# Patient Record
Sex: Female | Born: 1985 | Race: White | Hispanic: No | Marital: Married | Smoking: Never smoker
Health system: Southern US, Community
[De-identification: ages and names within clinical notes are randomized; demographics above are authoritative.]

## PROBLEM LIST (undated history)

## (undated) DIAGNOSIS — J45909 Unspecified asthma, uncomplicated: Secondary | ICD-10-CM

## (undated) DIAGNOSIS — N809 Endometriosis, unspecified: Secondary | ICD-10-CM

## (undated) DIAGNOSIS — E039 Hypothyroidism, unspecified: Secondary | ICD-10-CM

## (undated) DIAGNOSIS — Z87442 Personal history of urinary calculi: Secondary | ICD-10-CM

## (undated) DIAGNOSIS — R431 Parosmia: Secondary | ICD-10-CM

## (undated) HISTORY — PX: TONSILLECTOMY: SUR1361

## (undated) HISTORY — DX: Parosmia: R43.1

## (undated) HISTORY — DX: Unspecified asthma, uncomplicated: J45.909

## (undated) HISTORY — PX: WISDOM TOOTH EXTRACTION: SHX21

## (undated) HISTORY — DX: Endometriosis, unspecified: N80.9

---

## 2012-10-07 HISTORY — PX: EXCISION, ENDOMETRIOSIS, LAPAROSCOPIC: SHX7245

## 2015-11-21 ENCOUNTER — Other Ambulatory Visit (HOSPITAL_COMMUNITY): Payer: Self-pay | Admitting: Unknown Physician Specialty

## 2015-11-21 DIAGNOSIS — O4403 Placenta previa specified as without hemorrhage, third trimester: Secondary | ICD-10-CM

## 2015-11-24 ENCOUNTER — Ambulatory Visit (HOSPITAL_COMMUNITY)
Admission: RE | Admit: 2015-11-24 | Discharge: 2015-11-24 | Disposition: A | Discharge status: Home / Self Care | Payer: 59 | Source: Ambulatory Visit | Attending: Unknown Physician Specialty | Admitting: Unknown Physician Specialty

## 2015-11-24 ENCOUNTER — Encounter (HOSPITAL_COMMUNITY): Payer: Self-pay

## 2015-11-24 DIAGNOSIS — Z3A28 28 weeks gestation of pregnancy: Secondary | ICD-10-CM | POA: Insufficient documentation

## 2015-11-24 DIAGNOSIS — Z36 Encounter for antenatal screening of mother: Secondary | ICD-10-CM | POA: Insufficient documentation

## 2015-11-24 DIAGNOSIS — O43193 Other malformation of placenta, third trimester: Secondary | ICD-10-CM | POA: Insufficient documentation

## 2015-11-24 DIAGNOSIS — O4403 Placenta previa specified as without hemorrhage, third trimester: Secondary | ICD-10-CM

## 2015-11-24 HISTORY — DX: Hypothyroidism, unspecified: E03.9

## 2015-11-24 NOTE — Consult Note (Signed)
Maternal Fetal Medicine Consultation  Requesting Provider(s): Ernestina Penna, MD  Reason for consultation: Evaluation for possible placenta accreta  HPI: Kathy Lyons is a 30 yo G2P1001, EDD 02/16/2016 currently at 28w 0d seen for evaluation and consultation due to possible placenta accreta.  Ms. Rudy reports a history of a prior term SVD without complications - was concerned about a ? Manual extraction of the placenta, but the patient herself does not recall this procedure specifically (delivery note reports delivery of the placenta with gentle traction and fundal pressure per the patient's report).  She denies any previous uterine surgery or other risk factors for placenta accreta. Her prenatal course has otherwise been uncomplicated except for a history of hypothyroidism that has been well controlled on Synthroid.  OB History: OB History    Gravida Para Term Preterm AB TAB SAB Ectopic Multiple Living   PMH:  Past Medical History  Diagnosis Date  . Hypothyroidism     PSH:  Past Surgical History  Procedure Laterality Date  . Wisdom tooth extraction     Meds:  Current Outpatient Prescriptions on File Prior to Encounter  Medication Sig Dispense Refill  . Levothyroxine Sodium (SYNTHROID PO) Take by mouth.    . Prenatal Vit-Fe Fumarate-FA (PRENATAL VITAMIN PO) Take by mouth.     No current facility-administered medications on file prior to encounter.   Allergies:  Allergies  Allergen Reactions  . Keflex [Cephalexin]    FH: Denies family history of birth defects or hereditary disorders  Soc:  Social History   Social History  . Marital Status: Married    Spouse Name: N/A  . Number of Children: N/A  . Years of Education: N/A   Occupational History  . Not on file.   Social History Main Topics  . Smoking status: Not on file  . Smokeless tobacco: Not on file  . Alcohol Use: Not on file  . Drug Use: Not on file  . Sexual Activity: Not on file    Other Topics Concern  . Not on file   Social History Narrative  . No narrative on file    Review of Systems: no vaginal bleeding or cramping/contractions, no LOF, no nausea/vomiting. All other systems reviewed and are negative.  PE:  164 lbs, 111/69, 74  GEN: well-appearing female ABD: gravid, NT  Ultrasound: Single IUP at 28w 0d Somewhat limited views of the ductal arch / aortic arch obtained The remainder of the fetal anatomy appears normal Fetal growth is appropriate (65th %tile) Anterior placenta without previa - no sonographic markers associated with placenta accreta were appreciated. (on Color Doppler or B-mode sonography) Normal amniotic fluid volume  A/P: 1) Single IUP at 28w 0d  2) Concerns for possible placenta accreta - no sonographic findings associated with placenta accreta were appreciated (no placental lacunae, normal clear space, normal Color flow Doppler).  In the absence of placenta previa or prior uterine surgery, feel that the risk for placenta accreta is extremely low.  We discussed the fact that the positive predictive value of both ultrasound and MRI is poor for the diagnosis of accreta and that ultimately it is a clinical diagnosis at the time of delivery.  Based on ultrasound findings and clinical picture, would not recommend further evaluation - and in the absence of other complications, should be able to delivery at [facility].   Thank you for the opportunity to be a part  of the care of Amandeep W Chism. Please contact our office if we can be of further assistance.   I spent approximately 30 minutes with this patient with over 50% of time spent in face-to-face counseling.  Alpha Gula, MD Maternal Fetal Medicine

## 2016-09-28 ENCOUNTER — Encounter (HOSPITAL_COMMUNITY): Payer: Self-pay

## 2017-11-28 DIAGNOSIS — Z6830 Body mass index (BMI) 30.0-30.9, adult: Secondary | ICD-10-CM | POA: Diagnosis not present

## 2017-11-28 DIAGNOSIS — J069 Acute upper respiratory infection, unspecified: Secondary | ICD-10-CM | POA: Diagnosis not present

## 2017-11-28 DIAGNOSIS — J309 Allergic rhinitis, unspecified: Secondary | ICD-10-CM | POA: Diagnosis not present

## 2018-01-09 DIAGNOSIS — Z1331 Encounter for screening for depression: Secondary | ICD-10-CM | POA: Diagnosis not present

## 2018-01-09 DIAGNOSIS — Z1389 Encounter for screening for other disorder: Secondary | ICD-10-CM | POA: Diagnosis not present

## 2018-01-09 DIAGNOSIS — Z6830 Body mass index (BMI) 30.0-30.9, adult: Secondary | ICD-10-CM | POA: Diagnosis not present

## 2018-01-09 DIAGNOSIS — R635 Abnormal weight gain: Secondary | ICD-10-CM | POA: Diagnosis not present

## 2018-04-03 DIAGNOSIS — Z01419 Encounter for gynecological examination (general) (routine) without abnormal findings: Secondary | ICD-10-CM | POA: Diagnosis not present

## 2018-04-03 DIAGNOSIS — Z6832 Body mass index (BMI) 32.0-32.9, adult: Secondary | ICD-10-CM | POA: Diagnosis not present

## 2018-04-22 DIAGNOSIS — Z6830 Body mass index (BMI) 30.0-30.9, adult: Secondary | ICD-10-CM | POA: Diagnosis not present

## 2018-04-22 DIAGNOSIS — J029 Acute pharyngitis, unspecified: Secondary | ICD-10-CM | POA: Diagnosis not present

## 2018-04-22 DIAGNOSIS — J019 Acute sinusitis, unspecified: Secondary | ICD-10-CM | POA: Diagnosis not present

## 2018-06-05 DIAGNOSIS — L700 Acne vulgaris: Secondary | ICD-10-CM | POA: Diagnosis not present

## 2018-06-05 DIAGNOSIS — D1801 Hemangioma of skin and subcutaneous tissue: Secondary | ICD-10-CM | POA: Diagnosis not present

## 2018-06-05 DIAGNOSIS — D485 Neoplasm of uncertain behavior of skin: Secondary | ICD-10-CM | POA: Diagnosis not present

## 2018-06-05 DIAGNOSIS — D2262 Melanocytic nevi of left upper limb, including shoulder: Secondary | ICD-10-CM | POA: Diagnosis not present

## 2018-06-05 DIAGNOSIS — D2371 Other benign neoplasm of skin of right lower limb, including hip: Secondary | ICD-10-CM | POA: Diagnosis not present

## 2018-06-12 DIAGNOSIS — Z6829 Body mass index (BMI) 29.0-29.9, adult: Secondary | ICD-10-CM | POA: Diagnosis not present

## 2018-06-12 DIAGNOSIS — J209 Acute bronchitis, unspecified: Secondary | ICD-10-CM | POA: Diagnosis not present

## 2018-07-17 DIAGNOSIS — Z23 Encounter for immunization: Secondary | ICD-10-CM | POA: Diagnosis not present

## 2018-08-28 DIAGNOSIS — J209 Acute bronchitis, unspecified: Secondary | ICD-10-CM | POA: Diagnosis not present

## 2018-08-28 DIAGNOSIS — Z6829 Body mass index (BMI) 29.0-29.9, adult: Secondary | ICD-10-CM | POA: Diagnosis not present

## 2018-09-28 DIAGNOSIS — L209 Atopic dermatitis, unspecified: Secondary | ICD-10-CM | POA: Diagnosis not present

## 2018-09-28 DIAGNOSIS — Z6829 Body mass index (BMI) 29.0-29.9, adult: Secondary | ICD-10-CM | POA: Diagnosis not present

## 2018-10-30 DIAGNOSIS — N809 Endometriosis, unspecified: Secondary | ICD-10-CM | POA: Diagnosis not present

## 2018-10-30 DIAGNOSIS — Z01419 Encounter for gynecological examination (general) (routine) without abnormal findings: Secondary | ICD-10-CM | POA: Diagnosis not present

## 2018-10-30 DIAGNOSIS — E559 Vitamin D deficiency, unspecified: Secondary | ICD-10-CM | POA: Diagnosis not present

## 2018-10-30 DIAGNOSIS — E039 Hypothyroidism, unspecified: Secondary | ICD-10-CM | POA: Diagnosis not present

## 2018-11-30 DIAGNOSIS — J209 Acute bronchitis, unspecified: Secondary | ICD-10-CM | POA: Diagnosis not present

## 2018-11-30 DIAGNOSIS — Z6830 Body mass index (BMI) 30.0-30.9, adult: Secondary | ICD-10-CM | POA: Diagnosis not present

## 2019-03-05 DIAGNOSIS — Z209 Contact with and (suspected) exposure to unspecified communicable disease: Secondary | ICD-10-CM | POA: Diagnosis not present

## 2019-03-12 DIAGNOSIS — J0101 Acute recurrent maxillary sinusitis: Secondary | ICD-10-CM | POA: Diagnosis not present

## 2019-04-16 DIAGNOSIS — J019 Acute sinusitis, unspecified: Secondary | ICD-10-CM | POA: Diagnosis not present

## 2019-04-16 DIAGNOSIS — Z6830 Body mass index (BMI) 30.0-30.9, adult: Secondary | ICD-10-CM | POA: Diagnosis not present

## 2019-04-16 DIAGNOSIS — J45901 Unspecified asthma with (acute) exacerbation: Secondary | ICD-10-CM | POA: Diagnosis not present

## 2020-06-02 ENCOUNTER — Encounter: Payer: Self-pay | Admitting: Allergy & Immunology

## 2020-06-02 ENCOUNTER — Other Ambulatory Visit: Payer: Self-pay

## 2020-06-02 ENCOUNTER — Ambulatory Visit (INDEPENDENT_AMBULATORY_CARE_PROVIDER_SITE_OTHER): Payer: PRIVATE HEALTH INSURANCE | Admitting: Allergy & Immunology

## 2020-06-02 VITALS — BP 124/86 | HR 68 | Temp 98.5°F | Resp 18 | Ht 62.0 in | Wt 181.4 lb

## 2020-06-02 DIAGNOSIS — J4540 Moderate persistent asthma, uncomplicated: Secondary | ICD-10-CM

## 2020-06-02 DIAGNOSIS — J3089 Other allergic rhinitis: Secondary | ICD-10-CM | POA: Diagnosis not present

## 2020-06-02 DIAGNOSIS — J302 Other seasonal allergic rhinitis: Secondary | ICD-10-CM | POA: Diagnosis not present

## 2020-06-02 NOTE — Patient Instructions (Addendum)
1. Moderate persistent asthma, uncomplicated - Lung testing looked good today. - You are having fairly frequent asthma symptoms, so I think you should use the Symbicort every day for the best control. - Spacer sample and demonstration provided. - Daily controller medication(s): Symbicort 160/4.38mcg two puffs twice daily with spacer - Prior to physical activity: albuterol 2 puffs 10-15 minutes before physical activity. - Rescue medications: albuterol 4 puffs every 4-6 hours as needed - Asthma control goals:  * Full participation in all desired activities (may need albuterol before activity) * Albuterol use two time or less a week on average (not counting use with activity) * Cough interfering with sleep two time or less a month * Oral steroids no more than once a year * No hospitalizations  2. Seasonal and perennial allergic rhinitis - Testing today showed: grasses, ragweed, weeds, trees, indoor molds, outdoor molds, dust mites, cat and dog - Copy of test results provided.  - Avoidance measures provided. - Stop taking: Claritin - Start taking: Xyzal (levocetirizine) 5mg  tablet once daily, Flonase (fluticasone) one spray per nostril daily and Astelin (azelastine) 2 sprays per nostril 1-2 times daily as needed - You can use an extra dose of the antihistamine, if needed, for breakthrough symptoms.  - Consider nasal saline rinses 1-2 times daily to remove allergens from the nasal cavities as well as help with mucous clearance (this is especially helpful to do before the nasal sprays are given) - Consider allergy shots as a means of long-term control. - Allergy shots "re-train" and "reset" the immune system to ignore environmental allergens and decrease the resulting immune response to those allergens (sneezing, itchy watery eyes, runny nose, nasal congestion, etc).  - Allergy shots improve symptoms in 75-85% of patients.  - We can discuss more at the next appointment if the medications are not  working for you.  3. Return in about 6 weeks (around 07/14/2020).   Please inform 09/13/2020 of any Emergency Department visits, hospitalizations, or changes in symptoms. Call us before going to the ED for breathing or allergy symptoms since we might be able to fit you in for a sick visit. Feel free to contact us anytime with any questions, problems, or concerns.  It was a pleasure to meet you today!  Websites that have reliable patient information: 1. American Academy of Asthma, Allergy, and Immunology: www.aaaai.org 2. Food Allergy Research and Education (FARE): foodallergy.org 3. Mothers of Asthmatics: http://www.asthmacommunitynetwork.org 4. American College of Allergy, Asthma, and Immunology: www.acaai.org   COVID-19 Vaccine Information can be found at: [place] For questions related to vaccine distribution or appointments, please email [email or web address] or call [phone].     "Like" [phone] on Facebook and Instagram for our latest updates!        Make sure you are registered to vote! If you have moved or changed any of your contact information, you will need to get this updated before voting!  In some cases, you MAY be able to register to vote online: [place]    Reducing Pollen Exposure  The American Academy of Allergy, Asthma and Immunology suggests the following steps to reduce your exposure to pollen during allergy seasons.    1. Do not hang sheets or clothing out to dry; pollen may collect on these items. 2. Do not mow lawns or spend time around freshly cut grass; mowing stirs up pollen. 3. Keep windows closed at night.  Keep car windows closed while driving. 4. Minimize morning activities outdoors, a time when pollen counts are usually  at their highest. 5. Stay indoors as much as possible when pollen counts or humidity is high and on windy days when pollen tends to  remain in the air longer. 6. Use air conditioning when possible.  Many air conditioners have filters that trap the pollen spores. 7. Use a HEPA room air filter to remove pollen form the indoor air you breathe.  Control of Mold Allergen   Mold and fungi can grow on a variety of surfaces provided certain temperature and moisture conditions exist.  Outdoor molds grow on plants, decaying vegetation and soil.  The major outdoor mold, Alternaria and Cladosporium, are found in very high numbers during hot and dry conditions.  Generally, a late Summer - Fall peak is seen for common outdoor fungal spores.  Rain will temporarily lower outdoor mold spore count, but counts rise rapidly when the rainy period ends.  The most important indoor molds are Aspergillus and Penicillium.  Dark, humid and poorly ventilated basements are ideal sites for mold growth.  The next most common sites of mold growth are the bathroom and the kitchen.  Outdoor (Seasonal) Mold Control   1. Use air conditioning and keep windows closed 2. Avoid exposure to decaying vegetation. 3. Avoid leaf raking. 4. Avoid grain handling. 5. Consider wearing a face mask if working in moldy areas.    Indoor (Perennial) Mold Control    1. Maintain humidity below 50%. 2. Clean washable surfaces with 5% bleach solution. 3. Remove sources e.g. contaminated carpets.     Control of Dust Mite Allergen    Dust mites play a major role in allergic asthma and rhinitis.  They occur in environments with high humidity wherever human skin is found.  Dust mites absorb humidity from the atmosphere (ie, they do not drink) and feed on organic matter (including shed human and animal skin).  Dust mites are a microscopic type of insect that you cannot see with the naked eye.  High levels of dust mites have been detected from mattresses, pillows, carpets, upholstered furniture, bed covers, clothes, soft toys and any woven material.  The principal allergen of  the dust mite is found in its feces.  A gram of dust may contain 1,000 mites and 250,000 fecal particles.  Mite antigen is easily measured in the air during house cleaning activities.  Dust mites do not bite and do not cause harm to humans, other than by triggering allergies/asthma.    Ways to decrease your exposure to dust mites in your home:  1. Encase mattresses, box springs and pillows with a mite-impermeable barrier or cover   2. Wash sheets, blankets and drapes weekly in hot water (130 F) with detergent and dry them in a dryer on the hot setting.  3. Have the room cleaned frequently with a vacuum cleaner and a damp dust-mop.  For carpeting or rugs, vacuuming with a vacuum cleaner equipped with a high-efficiency particulate air (HEPA) filter.  The dust mite allergic individual should not be in a room which is being cleaned and should wait 1 hour after cleaning before going into the room. 4. Do not sleep on upholstered furniture (eg, couches).   5. If possible removing carpeting, upholstered furniture and drapery from the home is ideal.  Horizontal blinds should be eliminated in the rooms where the person spends the most time (bedroom, study, television room).  Washable vinyl, roller-type shades are optimal. 6. Remove all non-washable stuffed toys from the bedroom.  Wash stuffed toys weekly like sheets and  blankets above.   7. Reduce indoor humidity to less than 50%.  Inexpensive humidity monitors can be purchased at most hardware stores.  Do not use a humidifier as can make the problem worse and are not recommended.  Control of Dog or Cat Allergen  Avoidance is the best way to manage a dog or cat allergy. If you have a dog or cat and are allergic to dog or cats, consider removing the dog or cat from the home. If you have a dog or cat but don't want to find it a new home, or if your family wants a pet even though someone in the household is allergic, here are some strategies that may help keep  symptoms at bay:  1. Keep the pet out of your bedroom and restrict it to only a few rooms. Be advised that keeping the dog or cat in only one room will not limit the allergens to that room. 2. Don't pet, hug or kiss the dog or cat; if you do, wash your hands with soap and water. 3. High-efficiency particulate air (HEPA) cleaners run continuously in a bedroom or living room can reduce allergen levels over time. 4. Regular use of a high-efficiency vacuum cleaner or a central vacuum can reduce allergen levels. 5. Giving your dog or cat a bath at least once a week can reduce airborne allergen.  Allergy Shots   Allergies are the result of a chain reaction that starts in the immune system. Your immune system controls how your body defends itself. For instance, if you have an allergy to pollen, your immune system identifies pollen as an invader or allergen. Your immune system overreacts by producing antibodies called Immunoglobulin E (IgE). These antibodies travel to cells that release chemicals, causing an allergic reaction.  The concept behind allergy immunotherapy, whether it is received in the form of shots or tablets, is that the immune system can be desensitized to specific allergens that trigger allergy symptoms. Although it requires time and patience, the payback can be long-term relief.  How Do Allergy Shots Work?  Allergy shots work much like a vaccine. Your body responds to injected amounts of a particular allergen given in increasing doses, eventually developing a resistance and tolerance to it. Allergy shots can lead to decreased, minimal or no allergy symptoms.  There generally are two phases: build-up and maintenance. Build-up often ranges from three to six months and involves receiving injections with increasing amounts of the allergens. The shots are typically given once or twice a week, though more rapid build-up schedules are sometimes used.  The maintenance phase begins when the most  effective dose is reached. This dose is different for each person, depending on how allergic you are and your response to the build-up injections. Once the maintenance dose is reached, there are longer periods between injections, typically two to four weeks.  Occasionally doctors give cortisone-type shots that can temporarily reduce allergy symptoms. These types of shots are different and should not be confused with allergy immunotherapy shots.  Who Can Be Treated with Allergy Shots?  Allergy shots may be a good treatment approach for people with allergic rhinitis (hay fever), allergic asthma, conjunctivitis (eye allergy) or stinging insect allergy.   Before deciding to begin allergy shots, you should consider:  . The length of allergy season and the severity of your symptoms . Whether medications and/or changes to your environment can control your symptoms . Your desire to avoid long-term medication use . Time: allergy immunotherapy requires  a major time commitment . Cost: may vary depending on your insurance coverage  Allergy shots for children age 69 and older are effective and often well tolerated. They might prevent the onset of new allergen sensitivities or the progression to asthma.  Allergy shots are not started on patients who are pregnant but can be continued on patients who become pregnant while receiving them. In some patients with other medical conditions or who take certain common medications, allergy shots may be of risk. It is important to mention other medications you talk to your allergist.   When Will I Feel Better?  Some may experience decreased allergy symptoms during the build-up phase. For others, it may take as long as 12 months on the maintenance dose. If there is no improvement after a year of maintenance, your allergist will discuss other treatment options with you.  If you aren't responding to allergy shots, it may be because there is not enough dose of the  allergen in your vaccine or there are missing allergens that were not identified during your allergy testing. Other reasons could be that there are high levels of the allergen in your environment or major exposure to non-allergic triggers like tobacco smoke.  What Is the Length of Treatment?  Once the maintenance dose is reached, allergy shots are generally continued for three to five years. The decision to stop should be discussed with your allergist at that time. Some people may experience a permanent reduction of allergy symptoms. Others may relapse and a longer course of allergy shots can be considered.  What Are the Possible Reactions?  The two types of adverse reactions that can occur with allergy shots are local and systemic. Common local reactions include very mild redness and swelling at the injection site, which can happen immediately or several hours after. A systemic reaction, which is less common, affects the entire body or a particular body system. They are usually mild and typically respond quickly to medications. Signs include increased allergy symptoms such as sneezing, a stuffy nose or hives.  Rarely, a serious systemic reaction called anaphylaxis can develop. Symptoms include swelling in the throat, wheezing, a feeling of tightness in the chest, nausea or dizziness. Most serious systemic reactions develop within 30 minutes of allergy shots. This is why it is strongly recommended you wait in your doctor's office for 30 minutes after your injections. Your allergist is trained to watch for reactions, and his or her staff is trained and equipped with the proper medications to identify and treat them.  Who Should Administer Allergy Shots?  The preferred location for receiving shots is your prescribing allergist's office. Injections can sometimes be given at another facility where the physician and staff are trained to recognize and treat reactions, and have received instructions by your  prescribing allergist.

## 2020-06-02 NOTE — Progress Notes (Addendum)
NEW PATIENT  Date of Service/Encounter:  06/05/20  Referring provider: Juliette Alcide, MD   Assessment:   Moderate persistent asthma, uncomplicated  Seasonal and perennial allergic rhinitis (grasses, ragweed, weeds, trees, indoor molds, outdoor molds, dust mites, cat and dog)  Plan/Recommendations:   1. Moderate persistent asthma, uncomplicated - Lung testing looked good today. - You are having fairly frequent asthma symptoms, so I think you should use the Symbicort every day for the best control. - Spacer sample and demonstration provided. - Daily controller medication(s): Symbicort 160/4.59mcg two puffs twice daily with spacer - Prior to physical activity: albuterol 2 puffs 10-15 minutes before physical activity. - Rescue medications: albuterol 4 puffs every 4-6 hours as needed - Asthma control goals:  * Full participation in all desired activities (may need albuterol before activity) * Albuterol use two time or less a week on average (not counting use with activity) * Cough interfering with sleep two time or less a month * Oral steroids no more than once a year * No hospitalizations  2. Seasonal and perennial allergic rhinitis - Testing today showed: grasses, ragweed, weeds, trees, indoor molds, outdoor molds, dust mites, cat and dog - Copy of test results provided.  - Avoidance measures provided. - Stop taking: Claritin - Start taking: Xyzal (levocetirizine) 5mg  tablet once daily, Flonase (fluticasone) one spray per nostril daily and Astelin (azelastine) 2 sprays per nostril 1-2 times daily as needed - You can use an extra dose of the antihistamine, if needed, for breakthrough symptoms.  - Consider nasal saline rinses 1-2 times daily to remove allergens from the nasal cavities as well as help with mucous clearance (this is especially helpful to do before the nasal sprays are given) - Consider allergy shots as a means of long-term control. - Allergy shots "re-train" and  "reset" the immune system to ignore environmental allergens and decrease the resulting immune response to those allergens (sneezing, itchy watery eyes, runny nose, nasal congestion, etc).  - Allergy shots improve symptoms in 75-85% of patients.  - We can discuss more at the next appointment if the medications are not working for you.  3. Return in about 6 weeks (around 07/14/2020).   Subjective:   CHARLESIA CANADAY is a 34 y.o. female presenting today for evaluation of  Chief Complaint  Patient presents with  . Allergic Rhinitis   . Wheezing    ADYSON VANBUREN has a history of the following: Patient Active Problem List   Diagnosis Date Noted  . Moderate persistent asthma, uncomplicated 06/05/2020  . Seasonal and perennial allergic rhinitis 06/05/2020    History obtained from: chart review and patient.  SALIMATA CHRISTENSON was referred by Rodena Piety, MD.      [place] is a 34 y.o. female presenting for an evaluation of allergies and asthma.  She did have COVID-19 in December 2020.  She seems of had lingering symptoms from that, and according to her PCP note she felt worse in February 2021.   Asthma/Respiratory Symptom History: She started having breathing problems after FluMist in October 2019. She was placed on prednisone for wheezing and respiratory illnesses every 2-3 months throughout 2020. She has continued to have wheezing issues over the last 18 months, even at baseline. She is currently on montelukast (added around one year ago). She had to have her albuterol inhaler refilled and she used that every now and then. She also uses Symbicort on a daily basis.   Allergic Rhinitis Symptom History: She had  environmental allergies as a child. She had a problem with asthmatic bronchitis. She would develop that in the fall. She has not had trouble in years with anything asthma related. She had allergies throughout her life and would alternate antihistamines.   She has a history of long  hauler COVID syndrome.   Otherwise, there is no history of other atopic diseases, including food allergies, drug allergies, stinging insect allergies, eczema, urticaria or contact dermatitis. There is no significant infectious history. Vaccinations are up to date.    Past Medical History: Patient Active Problem List   Diagnosis Date Noted  . Moderate persistent asthma, uncomplicated 06/05/2020  . Seasonal and perennial allergic rhinitis 06/05/2020    Medication List:  Allergies as of 06/02/2020      Reactions   Keflex [cephalexin]       Medication List       Accurate as of June 02, 2020 11:59 PM. If you have any questions, ask your nurse or doctor.        STOP taking these medications   Albuterol Sulfate 108 (90 Base) MCG/ACT Aepb Commonly known as: PROAIR RESPICLICK Replaced by: albuterol 108 (90 Base) MCG/ACT inhaler Stopped by: Alfonse Spruce, MD   budesonide-formoterol 80-4.5 MCG/ACT inhaler Commonly known as: SYMBICORT Replaced by: budesonide-formoterol 160-4.5 MCG/ACT inhaler Stopped by: Alfonse Spruce, MD     TAKE these medications   albuterol 108 (90 Base) MCG/ACT inhaler Commonly known as: VENTOLIN HFA 4 puffs every 4-6 hours as needed. May use 2 puffs 10-15 minutes before physical activity. Replaces: Albuterol Sulfate 108 (90 Base) MCG/ACT Aepb Started by: Alfonse Spruce, MD   ALPRAZolam 0.5 MG tablet Commonly known as: XANAX Take by mouth.   azelastine 0.1 % nasal spray Commonly known as: ASTELIN Place 2 sprays into both nostrils 2 (two) times daily. Started by: Alfonse Spruce, MD   Black Elderberry(Berry-Flower) 575 MG Caps Take by mouth.   budesonide-formoterol 160-4.5 MCG/ACT inhaler Commonly known as: Symbicort Inhale 2 puffs into the lungs 2 (two) times daily. With spacer. Replaces: budesonide-formoterol 80-4.5 MCG/ACT inhaler Started by: Alfonse Spruce, MD   fluticasone 50 MCG/ACT nasal spray Commonly known  as: FLONASE Place 1 spray into both nostrils daily. Started by: Alfonse Spruce, MD   levocetirizine 5 MG tablet Commonly known as: XYZAL Take 1 tablet (5 mg total) by mouth every evening. Started by: Alfonse Spruce, MD   levothyroxine 75 MCG tablet Commonly known as: SYNTHROID Take 75 mcg by mouth daily. What changed: Another medication with the same name was removed. Continue taking this medication, and follow the directions you see here. Changed by: Alfonse Spruce, MD   montelukast 10 MG tablet Commonly known as: SINGULAIR Take by mouth.   Multi-Vitamin tablet Take 1 tablet by mouth daily.   pantoprazole 40 MG tablet Commonly known as: PROTONIX Take 40 mg by mouth daily.   PRENATAL VITAMIN PO Take by mouth.   tretinoin 0.05 % cream Commonly known as: RETIN-A Apply topically.       Birth History: non-contributory  Developmental History: non-contributory  Past Surgical History: Past Surgical History:  Procedure Laterality Date  . TONSILLECTOMY    . WISDOM TOOTH EXTRACTION       Family History: Family History  Problem Relation Age of Onset  . Heart disease Mother   . Hypertension Mother   . Heart disease Father   . Hypertension Father   . Hypertension Maternal Grandmother   . Hypertension Maternal Grandfather   .  Hypertension Paternal Grandmother   . Hypertension Paternal Grandfather      Social History: [place] lives at home with her family.  There is no smoking exposure now, but she did around 19 years ago when she was a teenager.  They live in a house that was built in 2005.  There might be some mold under the home.  They have gas heating and central cooling.  There are cats outside of the home.  She does have dust mite covers on the bedding.  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  She is exposed to fumes, chemicals, or dust in the workplace.  She does not have a HEPA filter in the home.  She does not live near an  interstate or industrial area.    Review of Systems  Constitutional: Negative.  Negative for chills, fever, malaise/fatigue and weight loss.  HENT: Positive for congestion and sinus pain. Negative for ear discharge and ear pain.   Eyes: Negative for pain, discharge and redness.  Respiratory: Negative for cough, sputum production, shortness of breath and wheezing.   Cardiovascular: Negative.  Negative for chest pain and palpitations.  Gastrointestinal: Negative for abdominal pain, constipation, diarrhea, heartburn, nausea and vomiting.  Skin: Negative.  Negative for itching and rash.  Neurological: Negative for dizziness and headaches.  Endo/Heme/Allergies: Positive for environmental allergies. Does not bruise/bleed easily.       Objective:   Blood pressure 124/86, pulse 68, temperature 98.5 F (36.9 C), temperature source Temporal, resp. rate 18, height 5\' 2"  (1.575 m), weight 181 lb 6.4 oz (82.3 kg), SpO2 99 %, unknown if currently breastfeeding. Body mass index is 33.18 kg/m.   Physical Exam:   Physical Exam Constitutional:      Appearance: She is well-developed.     Comments: Pleasant female.  Cooperative with the exam.  HENT:     Head: Normocephalic and atraumatic.     Right Ear: Tympanic membrane, ear canal and external ear normal. No drainage, swelling or tenderness. Tympanic membrane is not injected, scarred, erythematous, retracted or bulging.     Left Ear: Tympanic membrane, ear canal and external ear normal. No drainage, swelling or tenderness. Tympanic membrane is not injected, scarred, erythematous, retracted or bulging.     Nose: No nasal deformity, septal deviation, mucosal edema or rhinorrhea.     Right Turbinates: Enlarged, swollen and pale.     Left Turbinates: Enlarged, swollen and pale.     Right Sinus: No maxillary sinus tenderness or frontal sinus tenderness.     Left Sinus: No maxillary sinus tenderness or frontal sinus tenderness.     Mouth/Throat:      Mouth: Mucous membranes are not pale and not dry.     Pharynx: Uvula midline.     Comments: Cobblestoning present in the posterior oropharynx. Eyes:     General:        Right eye: No discharge.        Left eye: No discharge.     Conjunctiva/sclera: Conjunctivae normal.     Right eye: Right conjunctiva is not injected. No chemosis.    Left eye: Left conjunctiva is not injected. No chemosis.    Pupils: Pupils are equal, round, and reactive to light.  Cardiovascular:     Rate and Rhythm: Normal rate and regular rhythm.     Heart sounds: Normal heart sounds.  Pulmonary:     Effort: Pulmonary effort is normal. No tachypnea, accessory muscle usage or respiratory distress.  Breath sounds: Normal breath sounds. No wheezing, rhonchi or rales.     Comments: Moving air well in all lung fields.  No increased work of breathing. Chest:     Chest wall: No tenderness.  Abdominal:     Tenderness: There is no abdominal tenderness. There is no guarding or rebound.  Lymphadenopathy:     Head:     Right side of head: No submandibular, tonsillar or occipital adenopathy.     Left side of head: No submandibular, tonsillar or occipital adenopathy.     Cervical: No cervical adenopathy.  Skin:    Coloration: Skin is not pale.     Findings: No abrasion, erythema, petechiae or rash. Rash is not papular, urticarial or vesicular.     Comments: No eczematous or urticarial lesions noted.  Neurological:     Mental Status: She is alert.      Diagnostic studies:   Spirometry: results normal (FEV1: 2.67/92%, FVC: 3.57/103%, FEV1/FVC: 75%).    Spirometry consistent with normal pattern.   Allergy Studies:     Airborne Adult Perc - 06/02/20 1439    Time Antigen Placed 1439    Allergen Manufacturer Waynette ButteryGreer    Location Back    Number of Test 59    Panel 1 Select    1. Control-Buffer 50% Glycerol Negative    2. Control-Histamine 1 mg/ml 2+    3. Albumin saline Negative    4. Bahia 3+    5. [place]  Negative    6. Johnson 3+    7. [place] Blue 3+    8. Meadow Fescue 4+    9. Perennial Rye 4+    10. Sweet Vernal 4+    11. Timothy 4+    12. Cocklebur Negative    13. Burweed Marshelder Negative    14. Ragweed, short 3+    15. Ragweed, Giant Negative    16. Plantain,  English 2+    17. Lamb's Quarters Negative    18. Sheep Sorrell Negative    19. Rough Pigweed Negative    20. Marsh Elder, Rough Negative    21. Mugwort, Common Negative    22. Ash mix Negative    23. Birch mix Negative    24. Beech American Negative    25. Box, Elder 2+    26. Cedar, red Negative    27. Cottonwood, Guinea[place]Biss[place]astern Negative    28. Elm mix Negative    29. Hickory Negative    30. Maple mix 3+    31. Oak, Guinea[place] mix 2+    32. Pecan Pollen Negative    33. Pine mix Negative    34. Sycamore Eastern Negative    35. Walnut, Black Pollen Negative    36. Alternaria alternata 2+    37. Cladosporium Herbarum Negative    38. Aspergillus mix Negative    39. Penicillium mix Negative    40. Bipolaris sorokiniana (Helminthosporium) Negative    41. Drechslera spicifera (Curvularia) Negative    42. Mucor plumbeus Negative    43. Fusarium moniliforme Negative    44. Aureobasidium pullulans (pullulara) Negative    45. Rhizopus oryzae Negative    46. Botrytis cinera Negative    47. Epicoccum nigrum 2+    48. Phoma betae Negative    49. Candida Albicans Negative    50. Trichophyton mentagrophytes Negative    51. Mite, D Farinae  5,000 AU/ml 4+    52. Mite, D Pteronyssinus  5,000 AU/ml 4+    53. Cat  Hair 10,000 BAU/ml 2+    54.  Dog Epithelia Negative    55. Mixed Feathers Negative    56. Horse Epithelia Negative    57. Cockroach, German Negative    58. Mouse Negative    59. Tobacco Leaf 2+          Intradermal - 06/02/20 1515    Time Antigen Placed 1515    Allergen Manufacturer Waynette Buttery    Location Arm    Number of Test 7    Intradermal Select    Control Negative    Weed mix 2+    Mold 2 1+    Mold 3  2+    Mold 4 Negative    Dog 4+    Cockroach Negative           Allergy testing results were read and interpreted by myself, documented by clinical staff.         Malachi Bonds, MD [facility]

## 2020-06-05 ENCOUNTER — Encounter: Payer: Self-pay | Admitting: Allergy & Immunology

## 2020-06-05 DIAGNOSIS — J4540 Moderate persistent asthma, uncomplicated: Secondary | ICD-10-CM | POA: Insufficient documentation

## 2020-06-05 DIAGNOSIS — J302 Other seasonal allergic rhinitis: Secondary | ICD-10-CM | POA: Insufficient documentation

## 2020-06-05 MED ORDER — FLUTICASONE PROPIONATE 50 MCG/ACT NA SUSP: 1.0000 | Freq: Every day | NASAL | 5 refills | Status: AC

## 2020-06-05 MED ORDER — AZELASTINE HCL 0.1 % NA SOLN: 2.0000 | Freq: Two times a day (BID) | NASAL | 5 refills | Status: AC

## 2020-06-05 MED ORDER — ALBUTEROL SULFATE HFA 108 (90 BASE) MCG/ACT IN AERS: INHALATION_SPRAY | RESPIRATORY_TRACT | 1 refills | Status: DC

## 2020-06-05 MED ORDER — LEVOCETIRIZINE DIHYDROCHLORIDE 5 MG PO TABS: 5.0000 mg | ORAL_TABLET | Freq: Every evening | ORAL | 5 refills | Status: DC

## 2020-06-05 MED ORDER — BUDESONIDE-FORMOTEROL FUMARATE 160-4.5 MCG/ACT IN AERO: 2.0000 | INHALATION_SPRAY | Freq: Two times a day (BID) | RESPIRATORY_TRACT | 5 refills | Status: DC

## 2020-07-21 ENCOUNTER — Ambulatory Visit: Payer: Self-pay | Admitting: Allergy & Immunology

## 2020-09-04 ENCOUNTER — Other Ambulatory Visit: Payer: Self-pay | Admitting: Allergy & Immunology

## 2020-10-25 ENCOUNTER — Emergency Department (HOSPITAL_COMMUNITY)
Admission: EM | Admit: 2020-10-25 | Discharge: 2020-10-25 | Disposition: A | Discharge status: Home / Self Care | Payer: PRIVATE HEALTH INSURANCE | Attending: Emergency Medicine | Admitting: Emergency Medicine

## 2020-10-25 ENCOUNTER — Encounter (HOSPITAL_COMMUNITY): Payer: Self-pay | Admitting: Emergency Medicine

## 2020-10-25 ENCOUNTER — Other Ambulatory Visit: Payer: Self-pay

## 2020-10-25 DIAGNOSIS — Z5321 Procedure and treatment not carried out due to patient leaving prior to being seen by health care provider: Secondary | ICD-10-CM | POA: Insufficient documentation

## 2020-10-25 DIAGNOSIS — R109 Unspecified abdominal pain: Secondary | ICD-10-CM | POA: Insufficient documentation

## 2020-10-25 NOTE — ED Triage Notes (Signed)
Pt c/o right flank pain that started a few hours ago.

## 2021-01-13 ENCOUNTER — Other Ambulatory Visit: Payer: Self-pay | Admitting: Obstetrics and Gynecology

## 2021-01-13 ENCOUNTER — Other Ambulatory Visit: Payer: Self-pay | Admitting: *Deleted

## 2021-01-13 DIAGNOSIS — N6452 Nipple discharge: Secondary | ICD-10-CM

## 2021-06-23 ENCOUNTER — Other Ambulatory Visit: Payer: Self-pay | Admitting: Allergy & Immunology

## 2021-07-10 ENCOUNTER — Other Ambulatory Visit: Payer: Self-pay | Admitting: Allergy & Immunology

## 2021-08-11 ENCOUNTER — Other Ambulatory Visit: Payer: Self-pay | Admitting: Allergy & Immunology

## 2021-08-29 ENCOUNTER — Other Ambulatory Visit: Payer: Self-pay | Admitting: Allergy & Immunology

## 2021-11-23 ENCOUNTER — Ambulatory Visit
Admission: RE | Admit: 2021-11-23 | Discharge: 2021-11-23 | Disposition: A | Discharge status: Home / Self Care | Payer: PRIVATE HEALTH INSURANCE | Source: Ambulatory Visit | Attending: Obstetrics and Gynecology | Admitting: Obstetrics and Gynecology

## 2021-11-23 DIAGNOSIS — N6452 Nipple discharge: Secondary | ICD-10-CM

## 2022-08-03 IMAGING — MG DIGITAL DIAGNOSTIC BILAT W/ TOMO W/ CAD
6 of 12 series · 6 of 36 positions shown · non-contrast
Comparison: 07/30/2017.

CLINICAL DATA: Patient presents with bilateral non spontaneous
nipple discharge for years, and intermittent lateral right breast
pain. No reported lumps.



[R MLO synth-2D (1 of 2)]
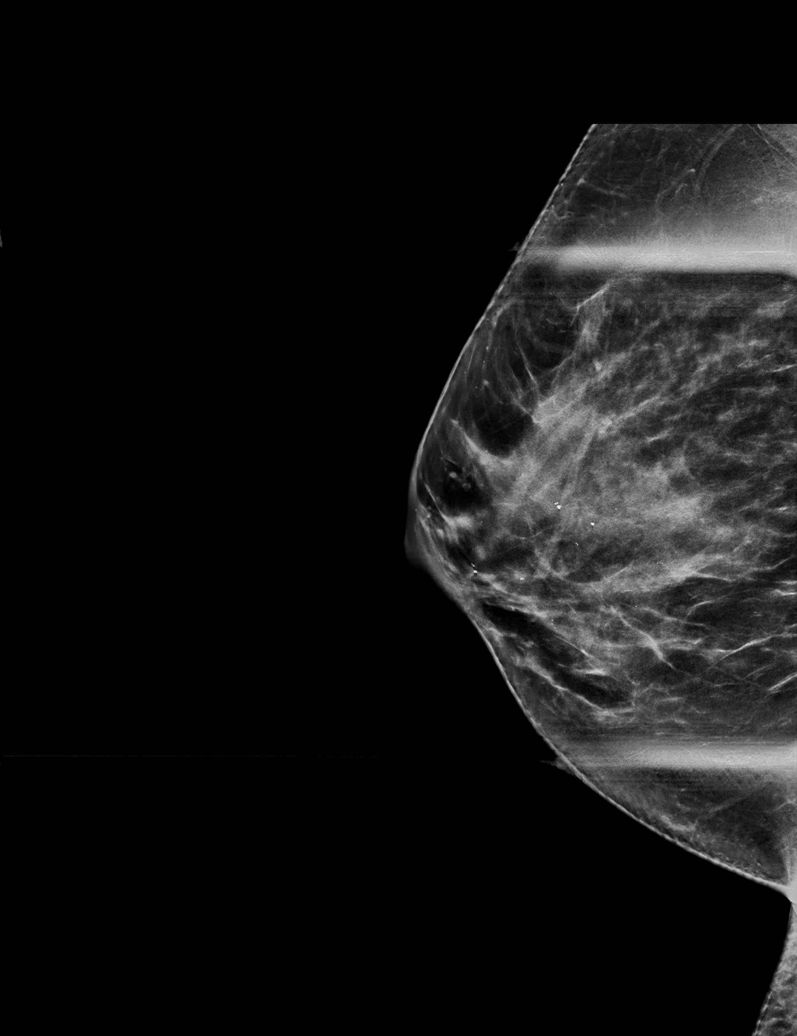

[L MLO synth-2D (1 of 2)]
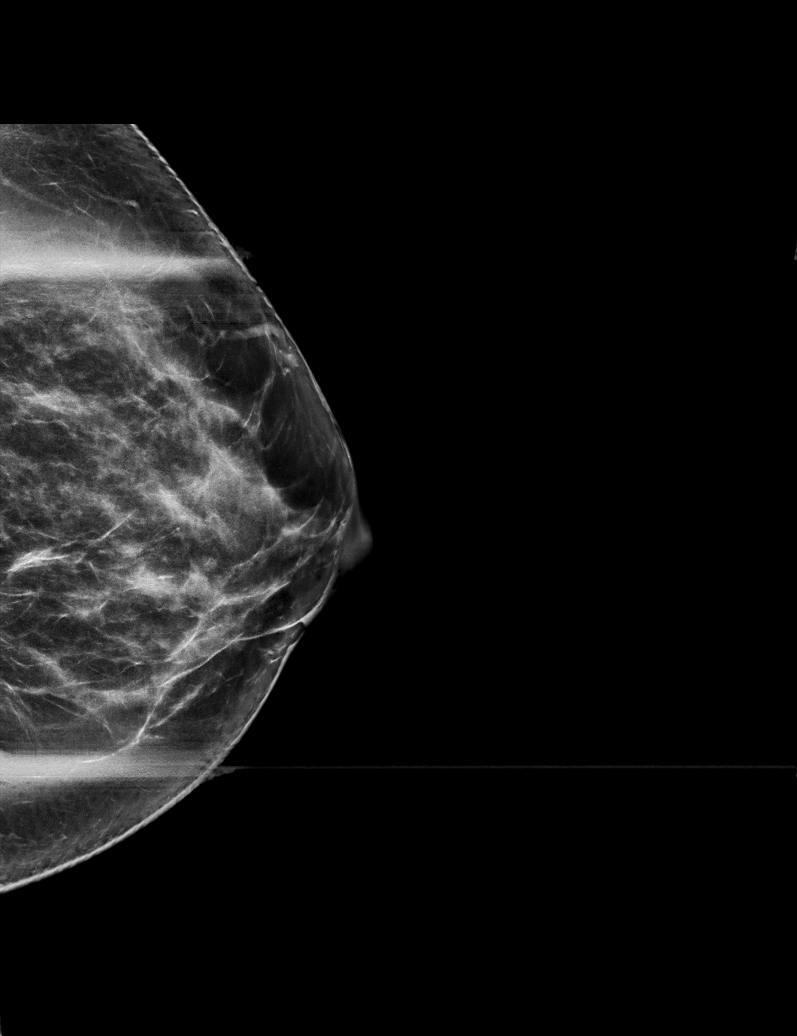

[L CC synth-2D]
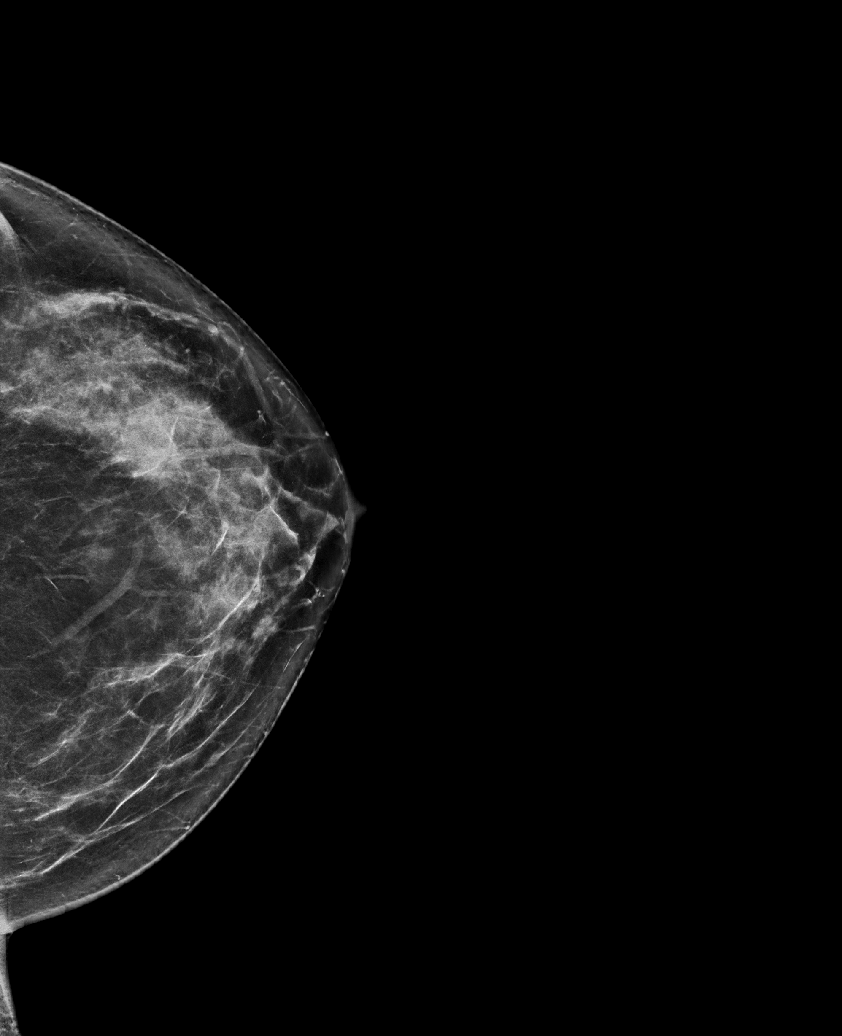

[L MLO synth-2D (2 of 2)]
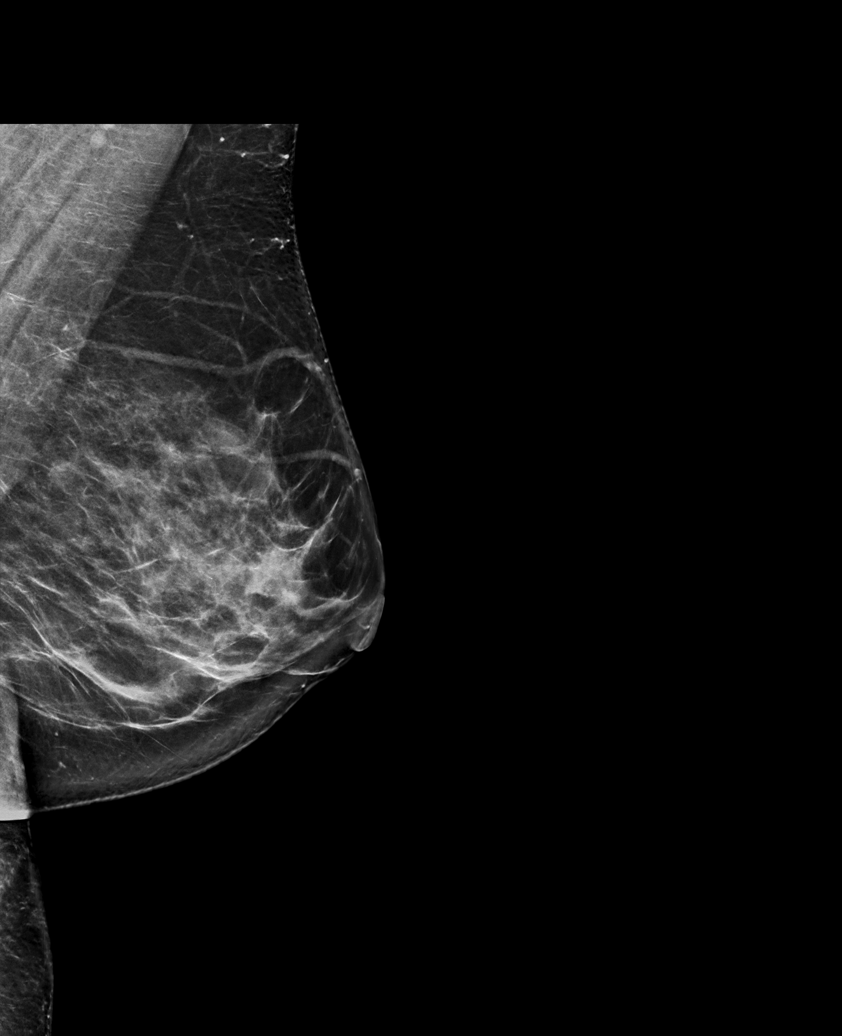

[R CC synth-2D]
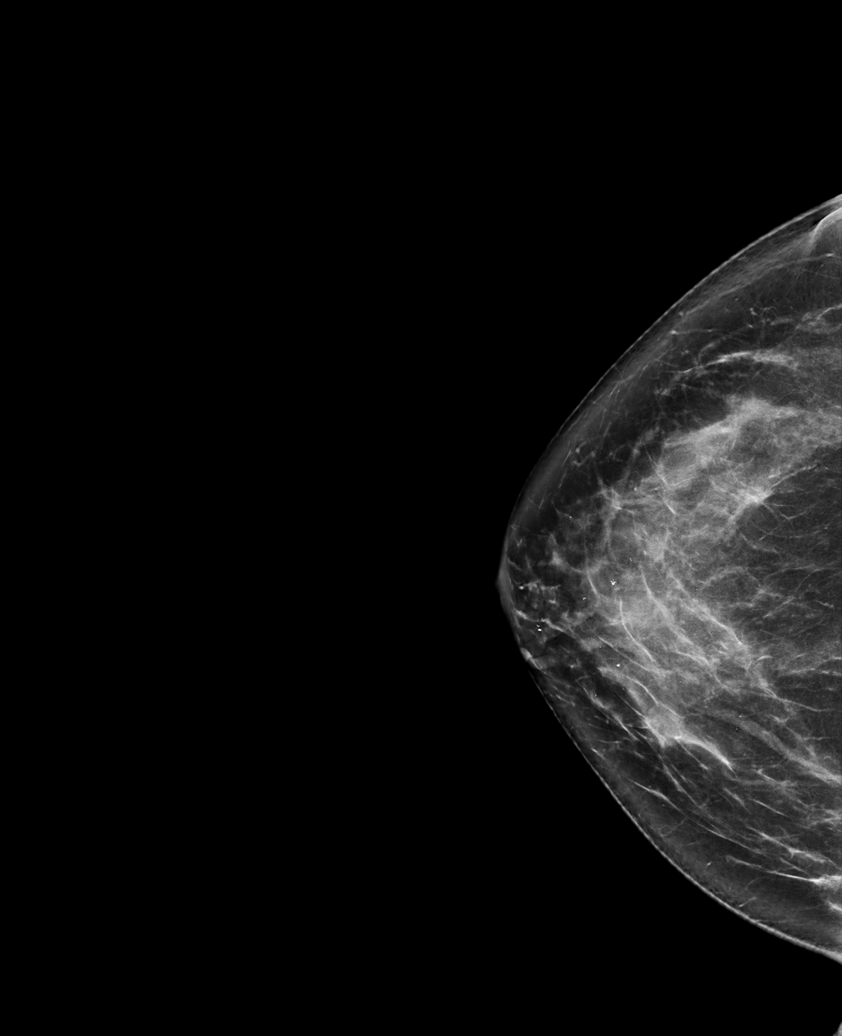

[R MLO synth-2D (2 of 2)]
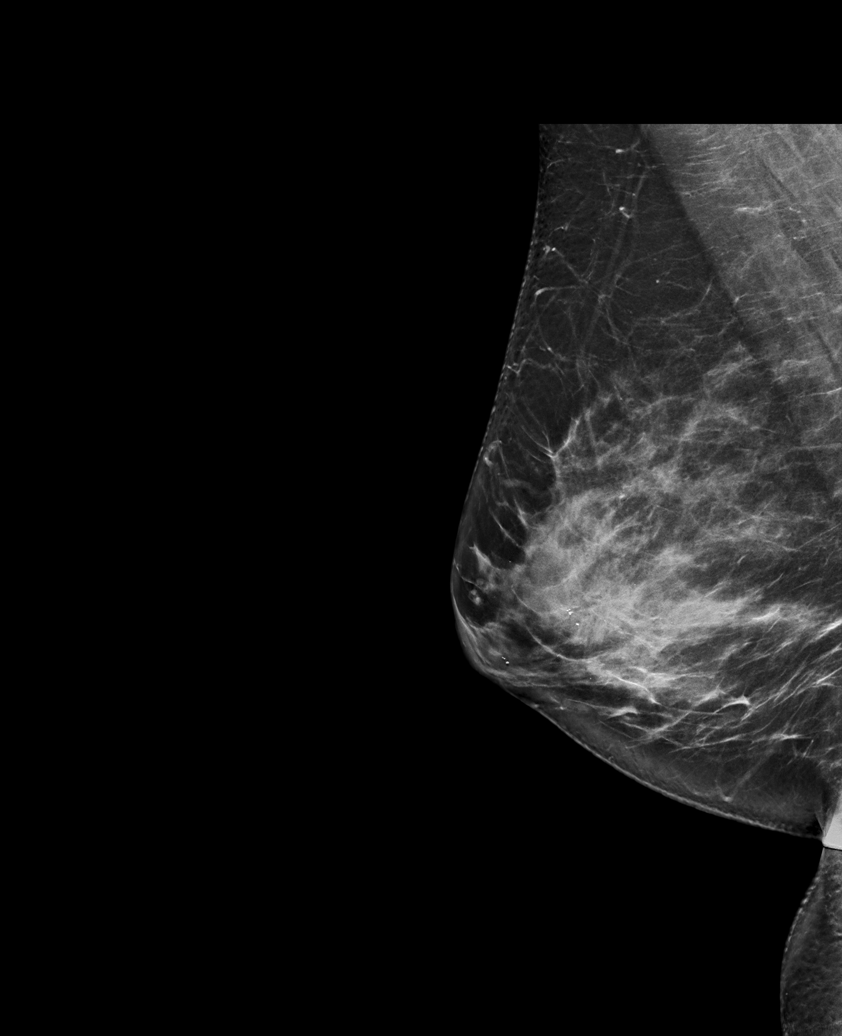

[6 of 36 positions shown; findings below may reference images not displayed]

ACR Breast Density Category c: The breast tissue is heterogeneously
dense, which may obscure small masses.
FINDINGS: There are no masses, areas of architectural distortion, areas of
significant asymmetry or suspicious calcifications. No mammographic
change.

Targeted bilateral retroareolar breast ultrasound is performed,
showing normal tissue. No masses. No dilated ducts.
IMPRESSION: 1. No evidence of breast malignancy.
2. Benign bilateral breast discharge.

RECOMMENDATION:
Screening mammogram at age 38 unless there are persistent or
intervening clinical concerns. (Code:68-P-WZK)

I have discussed the findings and recommendations with the patient.
If applicable, a reminder letter will be sent to the patient
regarding the next appointment.

BI-RADS CATEGORY  1: Negative.

## 2023-11-05 ENCOUNTER — Encounter (INDEPENDENT_AMBULATORY_CARE_PROVIDER_SITE_OTHER): Payer: Self-pay | Admitting: *Deleted

## 2023-11-06 ENCOUNTER — Telehealth: Payer: Self-pay | Admitting: Internal Medicine

## 2023-11-06 NOTE — Telephone Encounter (Signed)
Left detailed message for Kathy Lyons, I have called patient several times and left message with no response, letter was mailed to patient 11/05/23 asking her to call office and also one as faxed to PCP advising them of this

## 2023-11-06 NOTE — Telephone Encounter (Signed)
Caitlyn at [facility] left a message here about referral on this patient.  Asked for a return call [phone] ext. [phone]

## 2023-11-11 ENCOUNTER — Encounter (INDEPENDENT_AMBULATORY_CARE_PROVIDER_SITE_OTHER): Payer: Self-pay

## 2023-11-11 ENCOUNTER — Encounter (INDEPENDENT_AMBULATORY_CARE_PROVIDER_SITE_OTHER): Payer: Self-pay | Admitting: *Deleted

## 2023-12-10 ENCOUNTER — Encounter (INDEPENDENT_AMBULATORY_CARE_PROVIDER_SITE_OTHER): Payer: Self-pay

## 2023-12-12 ENCOUNTER — Encounter (INDEPENDENT_AMBULATORY_CARE_PROVIDER_SITE_OTHER): Payer: Self-pay | Admitting: Gastroenterology

## 2023-12-12 ENCOUNTER — Ambulatory Visit (INDEPENDENT_AMBULATORY_CARE_PROVIDER_SITE_OTHER): Payer: PRIVATE HEALTH INSURANCE | Admitting: Gastroenterology

## 2023-12-12 VITALS — BP 129/84 | HR 91 | Temp 98.3°F | Ht 63.0 in | Wt 196.8 lb

## 2023-12-12 DIAGNOSIS — K219 Gastro-esophageal reflux disease without esophagitis: Secondary | ICD-10-CM | POA: Insufficient documentation

## 2023-12-12 DIAGNOSIS — K921 Melena: Secondary | ICD-10-CM

## 2023-12-12 DIAGNOSIS — Z83719 Family history of colon polyps, unspecified: Secondary | ICD-10-CM

## 2023-12-12 DIAGNOSIS — Z6834 Body mass index (BMI) 34.0-34.9, adult: Secondary | ICD-10-CM

## 2023-12-12 DIAGNOSIS — K5904 Chronic idiopathic constipation: Secondary | ICD-10-CM | POA: Insufficient documentation

## 2023-12-12 DIAGNOSIS — R194 Change in bowel habit: Secondary | ICD-10-CM | POA: Diagnosis not present

## 2023-12-12 DIAGNOSIS — Z800 Family history of malignant neoplasm of digestive organs: Secondary | ICD-10-CM

## 2023-12-12 MED ORDER — PSYLLIUM 58.6 % PO PACK
1.0000 | PACK | Freq: Two times a day (BID) | ORAL | 2 refills | Status: AC
Start: 2023-12-12 — End: 2024-03-11

## 2023-12-12 NOTE — Patient Instructions (Signed)
 It was very nice to meet you today, as dicussed with will plan for the following :  1) Ensure adequate fluid intake: Aim for 8 glasses of water daily. Follow a high fiber diet: Include foods such as dates, prunes, pears, and kiwi. Use Metamucil twice a day.   2)Colonoscopy

## 2023-12-12 NOTE — Progress Notes (Signed)
 Vista Lawman , M.D. Gastroenterology & Hepatology [facility] Gastroenterology [address] [place], [place] [address] Primary Care Physician: Juliette Alcide, MD [address] [place] [address]  Chief Complaint:  Hematochezia ,change in bowel habits  History of Present Illness: Kathy Lyons is a 38 y.o. female with endometriosis, anxiety, GERD who presents for evaluation of change in bowel habits/stool caliber and hematochezia with family history of colon cancer  Patient reports for past 2 years she has intermittent fresh blood upon wiping and also mixed with stool.  She reports on Bristol stool scale bowel movement 6 and 7 without much straining has a bowel movement 2-3 times a day.  Patient reports recently her stool caliber has decreased and he looks unusual  Patient has intermittent reflux for which she takes intermittently PPI but takes famotidine daily for her allergies.The patient denies having any nausea, vomiting, fever, chills,  melena, hematemesis, abdominal distention, abdominal pain, diarrhea, jaundice, pruritus or weight loss.  Last YQI:HKVQ Last Colonoscopy:none  FHx: Grandmother had colon cancer at age 82 and mother has been getting screening colonoscopy 5 years Social: neg smoking, alcohol or illicit drug use Surgical: Excision endometriosis  Labs with hemoglobin 12.7 platelet 249 normal liver enzymes Past Medical History: Past Medical History:  Diagnosis Date   Asthma    Endometriosis    Hypothyroidism    Parosmia     Past Surgical History: Past Surgical History:  Procedure Laterality Date   EXCISION, ENDOMETRIOSIS, LAPAROSCOPIC  2014   TONSILLECTOMY     WISDOM TOOTH EXTRACTION      Family History: Family History  Problem Relation Age of Onset   Heart disease Mother    Hypertension Mother    Heart disease Father    Hypertension Father    Hypertension Maternal Grandmother    Colon cancer Maternal Grandmother  40   Hypertension Maternal Grandfather    Hypertension Paternal Grandmother    Hypertension Paternal Grandfather     Social History: Social History   Tobacco Use  Smoking Status Never   Passive exposure: Current  Smokeless Tobacco Never   Social History   Substance and Sexual Activity  Alcohol Use Yes   Comment: social   Social History   Substance and Sexual Activity  Drug Use Never    Allergies: Allergies  Allergen Reactions   Keflex [Cephalexin]    Vancomycin     Medications: Current Outpatient Medications  Medication Sig Dispense Refill   albuterol (VENTOLIN HFA) 108 (90 Base) MCG/ACT inhaler INHALE FOUR PUFFS EVERY 4 TO 6 HOURS AS NEEDED (MAY USE TWO PUFFS 10-15 MINUTES BEFORE PHYSICAL ACTIVITY) 17 g 1   ALPRAZolam (XANAX) 0.5 MG tablet Take by mouth.     azelastine (ASTELIN) 0.1 % nasal spray Place 2 sprays into both nostrils 2 (two) times daily. 30 mL 5   Black Elderberry,Berry-Flower, 575 MG CAPS Take by mouth.     budesonide-formoterol (SYMBICORT) 160-4.5 MCG/ACT inhaler INHALE TWO PUFFS INTO THE LUNGS TWICE DAILY (USE SPACER IF NEEDED) 10.2 g 0   Cholecalciferol (VITAMIN D-3 PO) Take by mouth. 10,000 units daily     famotidine (PEPCID) 40 MG tablet Take 40 mg by mouth daily.     fluticasone (FLONASE) 50 MCG/ACT nasal spray Place 1 spray into both nostrils daily. 16 g 5   levocetirizine (XYZAL) 5 MG tablet TAKE 1 TABLET BY MOUTH EVERY EVENING 30 tablet 0   levothyroxine (SYNTHROID) 75 MCG tablet Take 75 mcg by  mouth daily.     montelukast (SINGULAIR) 10 MG tablet Take by mouth.     Multiple Vitamin (MULTI-VITAMIN) tablet Take 1 tablet by mouth daily.     pantoprazole (PROTONIX) 40 MG tablet Take 40 mg by mouth daily.     Prenatal Vit-Fe Fumarate-FA (PRENATAL VITAMIN PO) Take by mouth.     Probiotic Product (PROBIOTIC DAILY PO) Take by mouth.     psyllium (METAMUCIL) 58.6 % packet Take 1 packet by mouth 2 (two) times daily. 60 packet 2   tretinoin (RETIN-A)  0.05 % cream Apply topically.     No current facility-administered medications for this visit.    Review of Systems: GENERAL: negative for malaise, night sweats HEENT: No changes in hearing or vision, no nose bleeds or other nasal problems. NECK: Negative for lumps, goiter, pain and significant neck swelling RESPIRATORY: Negative for cough, wheezing CARDIOVASCULAR: Negative for chest pain, leg swelling, palpitations, orthopnea GI: SEE HPI MUSCULOSKELETAL: Negative for joint pain or swelling, back pain, and muscle pain. SKIN: Negative for lesions, rash HEMATOLOGY Negative for prolonged bleeding, bruising easily, and swollen nodes. ENDOCRINE: Negative for cold or heat intolerance, polyuria, polydipsia and goiter. NEURO: negative for tremor, gait imbalance, syncope and seizures. The remainder of the review of systems is noncontributory.   Physical Exam: BP 129/84   Pulse 91   Temp 98.3 F (36.8 C)   Ht 5\' 3"  (1.6 m)   Wt 196 lb 12.8 oz (89.3 kg)   BMI 34.86 kg/m  GENERAL: The patient is AO x3, in no acute distress. HEENT: Head is normocephalic and atraumatic. EOMI are intact. Mouth is well hydrated and without lesions. NECK: Supple. No masses LUNGS: Clear to auscultation. No presence of rhonchi/wheezing/rales. Adequate chest expansion HEART: RRR, normal s1 and s2. ABDOMEN: Soft, nontender, no guarding, no peritoneal signs, and nondistended. BS +. No masses.  Imaging/Labs: as above      No data to display         No results found for: "IRON", "TIBC", "FERRITIN"  I personally reviewed and interpreted the available labs, imaging and endoscopic files.  Impression and Plan: Kathy Lyons is a 38 y.o. female with endometriosis, anxiety, GERD who presents for evaluation of change in bowel habits/stool caliber and hematochezia with family history of colon cancer  #Hematochezia #Change in stool Caliber #Family history of colon cancer  Patient with family histor of  premature colon cancer with grandmother at age 4 and mother with large polyps.  Hematochezia and change in stool caliber or alarm symptom and colonoscopy is recommended as next  Ensure adequate fluid intake: Aim for 8 glasses of water daily. Follow a high fiber diet: Include foods such as dates, prunes, pears, and kiwi. Use Metamucil twice a day.  #GERD Patient has intermittent reflux which is well-controlled by PPI which she takes as needed  #BMI 34      - walking at a brisk Szafran/biking at moderate intesity 2.5-5 hours per week     - use pedometer/step counter to track activity     - goal to lose 5-10% of initial body weight     - avoid suagry drinks and juices, use zero calorie beverages     - increase water intake     - eat a low carb diet with plenty of veggies and fruit     - Get sufficient sleep 7-8 hrs nightly     - maitain active lifestyle     - avoid alcohol     -  recommend 2-3 cups Coffee daily     - Counsel on lowering cholesterol by having a diet rich in vegetables,          protein (avoid red meats) and good fats(fish, salmon).    All questions were answered.      Vista Lawman, MD Gastroenterology and Hepatology [facility]   This chart has been completed using [facility] Dictation software, and while attempts have been made to ensure accuracy , certain words and phrases may not be transcribed as intended

## 2023-12-16 MED ORDER — PEG 3350-KCL-NA BICARB-NACL 420 G PO SOLR: 4000.0000 mL | Freq: Once | ORAL | 0 refills | Status: DC

## 2023-12-16 NOTE — Addendum Note (Signed)
 Addended by: Marlowe Shores on: 12/16/2023 08:36 AM   Modules accepted: Orders

## 2023-12-17 ENCOUNTER — Encounter (INDEPENDENT_AMBULATORY_CARE_PROVIDER_SITE_OTHER): Payer: Self-pay

## 2023-12-19 ENCOUNTER — Other Ambulatory Visit: Payer: Self-pay | Admitting: Obstetrics and Gynecology

## 2023-12-19 DIAGNOSIS — Z1231 Encounter for screening mammogram for malignant neoplasm of breast: Secondary | ICD-10-CM

## 2024-01-09 ENCOUNTER — Telehealth (INDEPENDENT_AMBULATORY_CARE_PROVIDER_SITE_OTHER): Payer: Self-pay | Admitting: *Deleted

## 2024-01-09 NOTE — Telephone Encounter (Signed)
 Pt contacted. Pt states she would like to wait until 04/30/24 due to having a lot of things going on and going out of town.

## 2024-01-09 NOTE — Telephone Encounter (Signed)
 Patient would like to reschd her procedure schd 01/16/24 - please call [phone]

## 2024-01-13 ENCOUNTER — Encounter (HOSPITAL_COMMUNITY): Payer: PRIVATE HEALTH INSURANCE

## 2024-01-16 ENCOUNTER — Encounter (HOSPITAL_COMMUNITY): Admission: RE | Payer: Self-pay | Source: Home / Self Care

## 2024-01-16 ENCOUNTER — Ambulatory Visit (HOSPITAL_COMMUNITY)
Admission: RE | Admit: 2024-01-16 | Payer: PRIVATE HEALTH INSURANCE | Source: Home / Self Care | Admitting: Gastroenterology

## 2024-01-16 SURGERY — COLONOSCOPY
Anesthesia: Choice

## 2024-01-30 NOTE — Telephone Encounter (Signed)
 Pt called into office and would like to have EGD when she has her TCS.  Pt is waiting to July since having so much to do in the coming months. Pt states she has acid reflux and possible hiatal hernia. Pt states she is not sure she mentioned this to provider at office visit.

## 2024-01-30 NOTE — Telephone Encounter (Signed)
 Noted. I have placed pt in the Future Scheduling folder I have made

## 2024-01-30 NOTE — Telephone Encounter (Signed)
 We can Add EGD with BRAVO , OFF PPI for refractory GERD

## 2024-03-10 NOTE — Telephone Encounter (Signed)
 Called pt, LMOVM to call back to to schedule TCS/EGD/BRAVO asa stated below  Called health plan and spoke with Pam I. PA approved procedures. Auth# [id number], DOS 03/10/24-06/08/24. Call ref # [id number]

## 2024-03-16 NOTE — Telephone Encounter (Signed)
LMOVM to call back to schedule 

## 2024-04-02 ENCOUNTER — Ambulatory Visit
Admission: RE | Admit: 2024-04-02 | Discharge: 2024-04-02 | Disposition: A | Discharge status: Home / Self Care | Payer: PRIVATE HEALTH INSURANCE | Source: Ambulatory Visit | Attending: Obstetrics and Gynecology | Admitting: Obstetrics and Gynecology

## 2024-04-02 DIAGNOSIS — Z1231 Encounter for screening mammogram for malignant neoplasm of breast: Secondary | ICD-10-CM

## 2024-06-18 ENCOUNTER — Encounter: Payer: Self-pay | Admitting: *Deleted

## 2024-06-18 ENCOUNTER — Other Ambulatory Visit: Payer: Self-pay | Admitting: *Deleted

## 2024-06-18 MED ORDER — PEG 3350-KCL-NA BICARB-NACL 420 G PO SOLR: 4000.0000 mL | Freq: Once | ORAL | 0 refills | Status: AC

## 2024-06-18 NOTE — Telephone Encounter (Signed)
 Called pt's health plan and spoke with Pam: PA approved, Auth #[id number], DOS: 07/29/24-10/27/24, ref# [id number]

## 2024-06-18 NOTE — Telephone Encounter (Signed)
 Pt has been rescheduled for 07/29/24. Instructions sent via mychart and prep sent to pharmacy.

## 2024-07-23 ENCOUNTER — Encounter (HOSPITAL_COMMUNITY): Payer: Self-pay

## 2024-07-23 ENCOUNTER — Encounter (HOSPITAL_COMMUNITY)
Admission: RE | Admit: 2024-07-23 | Discharge: 2024-07-23 | Disposition: A | Payer: PRIVATE HEALTH INSURANCE | Source: Ambulatory Visit | Attending: Gastroenterology

## 2024-07-23 VITALS — Ht 62.0 in | Wt 195.0 lb

## 2024-07-23 DIAGNOSIS — Z01818 Encounter for other preprocedural examination: Secondary | ICD-10-CM

## 2024-07-23 HISTORY — DX: Personal history of urinary calculi: Z87.442

## 2024-07-28 ENCOUNTER — Telehealth: Payer: Self-pay | Admitting: *Deleted

## 2024-07-28 NOTE — Telephone Encounter (Signed)
 Pt is wanting to know if she can cancel the BRAVO with her procedure tomorrow.  She doesn't feel she needs it at this time. Is it ok to cancel?  Please advise. Thank you

## 2024-07-28 NOTE — Telephone Encounter (Signed)
 Message sent to endo to cancel bravo.

## 2024-07-28 NOTE — Telephone Encounter (Signed)
 We can proceed with EGD and colonoscopy only at this time

## 2024-07-29 ENCOUNTER — Other Ambulatory Visit: Payer: Self-pay

## 2024-07-29 ENCOUNTER — Encounter (HOSPITAL_COMMUNITY)
Admission: RE | Disposition: A | Discharge status: Home / Self Care | Payer: Self-pay | Source: Home / Self Care | Attending: Gastroenterology

## 2024-07-29 ENCOUNTER — Ambulatory Visit (HOSPITAL_COMMUNITY)
Admission: RE | Admit: 2024-07-29 | Discharge: 2024-07-29 | Disposition: A | Discharge status: Home / Self Care | Payer: PRIVATE HEALTH INSURANCE | Attending: Gastroenterology | Admitting: Gastroenterology

## 2024-07-29 ENCOUNTER — Ambulatory Visit (HOSPITAL_COMMUNITY): Payer: PRIVATE HEALTH INSURANCE | Admitting: Certified Registered"

## 2024-07-29 ENCOUNTER — Encounter (HOSPITAL_COMMUNITY): Payer: Self-pay | Admitting: Gastroenterology

## 2024-07-29 DIAGNOSIS — K219 Gastro-esophageal reflux disease without esophagitis: Secondary | ICD-10-CM | POA: Insufficient documentation

## 2024-07-29 DIAGNOSIS — K648 Other hemorrhoids: Secondary | ICD-10-CM | POA: Insufficient documentation

## 2024-07-29 DIAGNOSIS — K2970 Gastritis, unspecified, without bleeding: Secondary | ICD-10-CM

## 2024-07-29 DIAGNOSIS — Z800 Family history of malignant neoplasm of digestive organs: Secondary | ICD-10-CM | POA: Diagnosis not present

## 2024-07-29 DIAGNOSIS — K921 Melena: Secondary | ICD-10-CM | POA: Insufficient documentation

## 2024-07-29 DIAGNOSIS — K2950 Unspecified chronic gastritis without bleeding: Secondary | ICD-10-CM | POA: Insufficient documentation

## 2024-07-29 DIAGNOSIS — K2100 Gastro-esophageal reflux disease with esophagitis, without bleeding: Secondary | ICD-10-CM

## 2024-07-29 DIAGNOSIS — K449 Diaphragmatic hernia without obstruction or gangrene: Secondary | ICD-10-CM | POA: Insufficient documentation

## 2024-07-29 DIAGNOSIS — I10 Essential (primary) hypertension: Secondary | ICD-10-CM

## 2024-07-29 DIAGNOSIS — Z01818 Encounter for other preprocedural examination: Secondary | ICD-10-CM

## 2024-07-29 DIAGNOSIS — E039 Hypothyroidism, unspecified: Secondary | ICD-10-CM

## 2024-07-29 DIAGNOSIS — K649 Unspecified hemorrhoids: Secondary | ICD-10-CM

## 2024-07-29 DIAGNOSIS — K641 Second degree hemorrhoids: Secondary | ICD-10-CM

## 2024-07-29 DIAGNOSIS — K3189 Other diseases of stomach and duodenum: Secondary | ICD-10-CM | POA: Insufficient documentation

## 2024-07-29 DIAGNOSIS — J45909 Unspecified asthma, uncomplicated: Secondary | ICD-10-CM | POA: Diagnosis not present

## 2024-07-29 HISTORY — PX: ESOPHAGOGASTRODUODENOSCOPY: SHX5428

## 2024-07-29 HISTORY — PX: COLONOSCOPY: SHX5424

## 2024-07-29 LAB — POCT PREGNANCY, URINE: Preg Test, Ur: NEGATIVE

## 2024-07-29 LAB — HM COLONOSCOPY

## 2024-07-29 SURGERY — COLONOSCOPY
Anesthesia: General

## 2024-07-29 MED ORDER — LACTATED RINGERS IV SOLN: INTRAVENOUS | Status: DC

## 2024-07-29 MED ORDER — PROPOFOL 10 MG/ML IV BOLUS
INTRAVENOUS | Status: DC | PRN
  Administered 2024-07-29: 100 mg via INTRAVENOUS
  Administered 2024-07-29 (×2): 40 mg via INTRAVENOUS
  Administered 2024-07-29: 125 ug/kg/min via INTRAVENOUS

## 2024-07-29 MED ORDER — LIDOCAINE 2% (20 MG/ML) 5 ML SYRINGE
INTRAMUSCULAR | Status: DC | PRN
Start: 2024-07-29 — End: 2024-07-29
  Administered 2024-07-29: 40 mg via INTRAVENOUS
  Administered 2024-07-29: 60 mg via INTRAVENOUS

## 2024-07-29 NOTE — Op Note (Signed)
 [facility] Patient Name: Kathy Lyons Procedure Date: 07/29/2024 10:27 AM MRN: [id number] Date of Birth: [birth year] Attending MD: Deatrice Dine , MD, [id number] CSN: [id number] Age: 38 Admit Type: Outpatient Procedure:                Colonoscopy Indications:              Hematochezia; family history of premature colon                            cancer Providers:                Deatrice Dine, MD, Harlene Lips, Rosina Sprague Referring MD:              Medicines:                Monitored Anesthesia Care Complications:            No immediate complications. Estimated Blood Loss:     Estimated blood loss: none. Procedure:                Pre-Anesthesia Assessment:                           - Prior to the procedure, a History and Physical                            was performed, and patient medications and                            allergies were reviewed. The patient's tolerance of                            previous anesthesia was also reviewed. The risks                            and benefits of the procedure and the sedation                            options and risks were discussed with the patient.                            All questions were answered, and informed consent                            was obtained. Prior Anticoagulants: The patient has                            taken no anticoagulant or antiplatelet agents. ASA                            Grade Assessment: II - A patient with mild systemic                            disease. After reviewing the risks and benefits,  the patient was deemed in satisfactory condition to                            undergo the procedure.                           After obtaining informed consent, the colonoscope                            was passed under direct vision. Throughout the                            procedure, the patient's blood pressure, pulse, and                            oxygen  saturations were monitored continuously. The                            CF-HQ190L ([id number]) Colon was introduced through                            the anus and advanced to the the terminal ileum.                            The colonoscopy was performed without difficulty.                            The patient tolerated the procedure well. The                            quality of the bowel preparation was evaluated                            using the BBPS [facility] Bowel Preparation Scale)                            with scores of: Right Colon = 3, Transverse Colon =                            3 and Left Colon = 3 (entire mucosa seen well with                            no residual staining, small fragments of stool or                            opaque liquid). The total BBPS score equals 9. The                            terminal ileum, ileocecal valve, appendiceal                            orifice, and rectum were photographed. Scope In: 10:57:00 AM Scope Out: 11:12:04 AM Scope Withdrawal Time: 0 hours 9  minutes 24 seconds  Total Procedure Duration: 0 hours 15 minutes 4 seconds  Findings:      The exam was otherwise normal throughout the examined colon.      The terminal ileum appeared normal.      Non-bleeding internal hemorrhoids were found during retroflexion. The       hemorrhoids were small. Impression:               - The examined portion of the ileum was normal.                           - Non-bleeding internal hemorrhoids.                           - No specimens collected. Moderate Sedation:      Per Anesthesia Care Recommendation:           - Patient has a contact number available for                            emergencies. The signs and symptoms of potential                            delayed complications were discussed with the                            patient. Return to normal activities tomorrow.                            Written discharge instructions were  provided to the                            patient.                           - Resume previous diet.                           - Continue present medications.                           - Repeat colonoscopy in 5 years for screening                            purposes; given family history of colon cancer .                           - Return to GI office as previously scheduled. Procedure Code(s):        --- Professional ---                           [phone], Colonoscopy, flexible; diagnostic, including                            collection of specimen(s) by brushing or washing,  when performed (separate procedure) Diagnosis Code(s):        --- Professional ---                           K64.8, Other hemorrhoids                           K92.1, Melena (includes Hematochezia) CPT copyright 2022 American Medical Association. All rights reserved. The codes documented in this report are preliminary and upon coder review may  be revised to meet current compliance requirements. Deatrice Dine, MD Deatrice Dine, MD 07/29/2024 11:19:25 AM This report has been signed electronically. Number of Addenda: 0

## 2024-07-29 NOTE — Anesthesia Procedure Notes (Signed)
 Date/Time: 07/29/2024 10:42 AM  Performed by: Para Jerelene CROME, CRNAOxygen Delivery Method: Nasal cannula Comments: OptiFlow Nasal Cannula.

## 2024-07-29 NOTE — Telephone Encounter (Signed)
 Pt informed that Bravo has been cancelled.

## 2024-07-29 NOTE — H&P (Signed)
 Primary Care Physician:  Lari Elspeth BRAVO, MD Primary Gastroenterologist:  Dr. Cinderella  Pre-Procedure History & Physical: HPI:  Kathy Lyons is a 38y.o. female with endometriosis, anxiety, GERD who presents for evaluation of change in bowel habits/stool caliber and hematochezia with family history of colon cancer   Patient reports for past 2 years she has intermittent fresh blood upon wiping and also mixed with stool.  She reports on Bristol stool scale bowel movement 6 and 7 without much straining has a bowel movement 2-3 times a day.  Patient reports recently her stool caliber has decreased and he looks unusual   Patient has intermittent reflux for which she takes intermittently PPI but takes famotidine daily for her allergies.The patient denies having any nausea, vomiting, fever, chills,  melena, hematemesis, abdominal distention, abdominal pain, diarrhea, jaundice, pruritus or weight loss.   Last ZHI:wnwz Last Colonoscopy:none   FHx: Grandmother had colon cancer at age 31 and mother has been getting screening colonoscopy 5 years Social: neg smoking, alcohol or illicit drug use Surgical: Excision endometriosis   Labs with hemoglobin 12.7 platelet 249 normal liver enzymes  Past Medical History:  Diagnosis Date   Asthma    Endometriosis    History of kidney stones    Hypothyroidism    Parosmia     Past Surgical History:  Procedure Laterality Date   EXCISION, ENDOMETRIOSIS, LAPAROSCOPIC  2014   TONSILLECTOMY     WISDOM TOOTH EXTRACTION      Prior to Admission medications   Medication Sig Start Date End Date Taking? Authorizing Provider  ALPRAZolam (XANAX) 0.5 MG tablet Take by mouth. 11/11/19  Yes [provider]  Black Elderberry,Berry-Flower, 575 MG CAPS Take by mouth.   Yes [provider]  famotidine (PEPCID) 40 MG tablet Take 40 mg by mouth daily.   Yes [provider]  levocetirizine (XYZAL ) 5 MG tablet TAKE 1 TABLET BY MOUTH EVERY EVENING  06/25/21  Yes Gallagher, Joel Louis, MD  levothyroxine (SYNTHROID) 75 MCG tablet Take 75 mcg by mouth daily. 05/31/20  Yes [provider]  montelukast (SINGULAIR) 10 MG tablet Take by mouth. 10/22/18  Yes [provider]  pantoprazole (PROTONIX) 40 MG tablet Take 40 mg by mouth daily. 03/29/20  Yes [provider]  Probiotic Product (PROBIOTIC DAILY PO) Take by mouth.   Yes [provider]  tretinoin (RETIN-A) 0.05 % cream Apply topically. 05/31/20  Yes [provider]  albuterol  (VENTOLIN  HFA) 108 (90 Base) MCG/ACT inhaler INHALE FOUR PUFFS EVERY 4 TO 6 HOURS AS NEEDED (MAY USE TWO PUFFS 10-15 MINUTES BEFORE PHYSICAL ACTIVITY) 09/04/20   Iva Marty Saltness, MD  azelastine  (ASTELIN ) 0.1 % nasal spray Place 2 sprays into both nostrils 2 (two) times daily. 06/05/20   Iva Marty Saltness, MD  budesonide -formoterol  (SYMBICORT ) 160-4.5 MCG/ACT inhaler INHALE TWO PUFFS INTO THE LUNGS TWICE DAILY (USE SPACER IF NEEDED) 07/10/21   Iva Marty Saltness, MD  Cholecalciferol (VITAMIN D-3 PO) Take by mouth. 10,000 units daily    [provider]  fluticasone  (FLONASE ) 50 MCG/ACT nasal spray Place 1 spray into both nostrils daily. 06/05/20   Iva Marty Saltness, MD  Multiple Vitamin (MULTI-VITAMIN) tablet Take 1 tablet by mouth daily.    [provider]  Prenatal Vit-Fe Fumarate-FA (PRENATAL VITAMIN PO) Take by mouth.    [provider]    Allergies as of 06/18/2024 - Review Complete 12/12/2023  Allergen Reaction Noted   Keflex [cephalexin]  11/24/2015   Vancomycin  10/25/2020  Family History  Problem Relation Age of Onset   Heart disease Mother    Hypertension Mother    Heart disease Father    Hypertension Father    Hypertension Maternal Grandmother    Colon cancer Maternal Grandmother 40   Hypertension Maternal Grandfather    Hypertension Paternal Grandmother    Hypertension Paternal Grandfather     Social History    Socioeconomic History   Marital status: Married    Spouse name: Not on file   Number of children: Not on file   Years of education: Not on file   Highest education level: Not on file  Occupational History   Not on file  Tobacco Use   Smoking status: Never    Passive exposure: Current   Smokeless tobacco: Never  Vaping Use   Vaping status: Never Used  Substance and Sexual Activity   Alcohol use: Yes    Comment: social   Drug use: Never   Sexual activity: Not on file  Other Topics Concern   Not on file  Social History Narrative   Not on file   Social Drivers of Health   Financial Resource Strain: Not on file  Food Insecurity: Not on file  Transportation Needs: Not on file  Physical Activity: Inactive (04/24/2023)   Received from [facility]   Exercise Vital Sign    On average, how many days per week do you engage in moderate to strenuous exercise (like a brisk walk)?: 0 days    On average, how many minutes do you engage in exercise at this level?: 0 min  Stress: No Stress Concern Present (04/24/2023)   Received from [facility] - Occupational Stress Questionnaire    Feeling of Stress : Only a little  Social Connections: Not on file  Intimate Partner Violence: Not At Risk (04/24/2023)   Received from [facility]   Humiliation, Afraid, Rape, and Kick questionnaire    Within the last year, have you been afraid of your partner or ex-partner?: No    Within the last year, have you been humiliated or emotionally abused in other ways by your partner or ex-partner?: No    Within the last year, have you been kicked, hit, slapped, or otherwise physically hurt by your partner or ex-partner?: No    Within the last year, have you been raped or forced to have any kind of sexual activity by your partner or ex-partner?: No    Review of Systems: See HPI, otherwise negative ROS  Physical Exam: Vital signs in last 24 hours:      General:   Alert,  Well-developed, well-nourished, pleasant and cooperative in NAD Head:  Normocephalic and atraumatic. Eyes:  Sclera clear, no icterus.   Conjunctiva pink. Ears:  Normal auditory acuity. Nose:  No deformity, discharge,  or lesions. Msk:  Symmetrical without gross deformities. Normal posture. Extremities:  Without clubbing or edema. Neurologic:  Alert and  oriented x4;  grossly normal neurologically. Skin:  Intact without significant lesions or rashes. Psych:  Alert and cooperative. Normal mood and affect.  Impression/Plan: Kathy Lyons is a 38y.o. female with endometriosis, anxiety, GERD who presents for evaluation of change in bowel habits/stool caliber and hematochezia with family history of colon cancer  Proceed with colonoscopy and upper endoscopy ( patient refusing BRAVO at this time)   The risks of the procedure including infection, bleed, or perforation as well as benefits, limitations, alternatives and imponderables have  been reviewed with the patient. Questions have been answered. All parties agreeable.

## 2024-07-29 NOTE — Op Note (Signed)
 [facility] Patient Name: Kathy Lyons Procedure Date: 07/29/2024 10:28 AM MRN: [id number] Date of Birth: 03/22/1986 Attending MD: Deatrice Dine , MD, [id number] CSN: [id number] Age: 38 Admit Type: Outpatient Procedure:                Upper GI endoscopy Indications:              Esophageal reflux symptoms that persist despite                            appropriate therapy Providers:                Deatrice Dine, MD, Harlene Lips, Rosina Sprague Referring MD:              Medicines:                Monitored Anesthesia Care Complications:            No immediate complications. Estimated Blood Loss:     Estimated blood loss was minimal. Procedure:                Pre-Anesthesia Assessment:                           - Prior to the procedure, a History and Physical                            was performed, and patient medications and                            allergies were reviewed. The patient's tolerance of                            previous anesthesia was also reviewed. The risks                            and benefits of the procedure and the sedation                            options and risks were discussed with the patient.                            All questions were answered, and informed consent                            was obtained. Prior Anticoagulants: The patient has                            taken no anticoagulant or antiplatelet agents. ASA                            Grade Assessment: II - A patient with mild systemic                            disease. After reviewing the risks and benefits,  the patient was deemed in satisfactory condition to                            undergo the procedure.                           After obtaining informed consent, the endoscope was                            passed under direct vision. Throughout the                            procedure, the patient's blood pressure, pulse, and                             oxygen saturations were monitored continuously. The                            [id number] ([id number]) Upper was introduced through                            the mouth, and advanced to the second part of                            duodenum. The upper GI endoscopy was accomplished                            without difficulty. The patient tolerated the                            procedure well. Scope In: 10:48:14 AM Scope Out: 10:53:22 AM Total Procedure Duration: 0 hours 5 minutes 8 seconds  Findings:      The examined esophagus was normal. Biopsies were obtained from the       proximal and distal esophagus with cold forceps for histology of       suspected eosinophilic esophagitis.      Esophagogastric landmarks were identified: the Z-line was found at 37 cm       and the Z-line was found at 39 cm from the incisors.      The gastroesophageal flap valve was visualized endoscopically and       classified as Hill Grade III (minimal fold, loose to endoscope, hiatal       hernia likely).      A 2 cm hiatal hernia was present.      Mildly erythematous mucosa without bleeding was found in the gastric       antrum. Biopsies were taken with a cold forceps for histology.      Nodular mucosa was found in the duodenal bulb. Biopsies were taken with       a cold forceps for histology. Impression:               - Normal esophagus.                           - Esophagogastric landmarks identified.                           -  Gastroesophageal flap valve classified as Hill                            Grade III (minimal fold, loose to endoscope, hiatal                            hernia likely).                           - 2 cm hiatal hernia.                           - Erythematous mucosa in the antrum. Biopsied.                           - Nodular mucosa in the duodenal bulb. Biopsied.                           - Biopsies were taken with a cold forceps for                             evaluation of eosinophilic esophagitis. Moderate Sedation:      Per Anesthesia Care Recommendation:           - Patient has a contact number available for                            emergencies. The signs and symptoms of potential                            delayed complications were discussed with the                            patient. Return to normal activities tomorrow.                            Written discharge instructions were provided to the                            patient.                           - Resume previous diet.                           - Continue present medications.                           - Await pathology results. Procedure Code(s):        --- Professional ---                           [phone], Esophagogastroduodenoscopy, flexible,                            transoral; with biopsy, single or multiple Diagnosis Code(s):        ---  Professional ---                           K44.9, Diaphragmatic hernia without obstruction or                            gangrene                           K31.89, Other diseases of stomach and duodenum                           K21.9, Gastro-esophageal reflux disease without                            esophagitis CPT copyright 2022 American Medical Association. All rights reserved. The codes documented in this report are preliminary and upon coder review may  be revised to meet current compliance requirements. Deatrice Dine, MD Deatrice Dine, MD 07/29/2024 11:16:34 AM This report has been signed electronically. Number of Addenda: 0

## 2024-07-29 NOTE — Anesthesia Preprocedure Evaluation (Signed)
 Anesthesia Evaluation  Patient identified by MRN, date of birth, ID band Patient awake    Reviewed: Allergy & Precautions, H&P , NPO status , Patient's Chart, lab work & pertinent test results, reviewed documented beta blocker date and time   Airway Mallampati: II  TM Distance: >3 FB Neck ROM: full    Dental no notable dental hx.    Pulmonary asthma    Pulmonary exam normal breath sounds clear to auscultation       Cardiovascular Exercise Tolerance: Good hypertension, negative cardio ROS  Rhythm:regular Rate:Normal     Neuro/Psych negative neurological ROS  negative psych ROS   GI/Hepatic Neg liver ROS,GERD  ,,  Endo/Other  Hypothyroidism    Renal/GU negative Renal ROS  negative genitourinary   Musculoskeletal   Abdominal   Peds  Hematology negative hematology ROS (+)   Anesthesia Other Findings   Reproductive/Obstetrics negative OB ROS                              Anesthesia Physical Anesthesia Plan  ASA: 2  Anesthesia Plan: General   Post-op Pain Management:    Induction:   PONV Risk Score and Plan: Propofol infusion  Airway Management Planned:   Additional Equipment:   Intra-op Plan:   Post-operative Plan:   Informed Consent: I have reviewed the patients History and Physical, chart, labs and discussed the procedure including the risks, benefits and alternatives for the proposed anesthesia with the patient or authorized representative who has indicated his/her understanding and acceptance.     Dental Advisory Given  Plan Discussed with: CRNA  Anesthesia Plan Comments:         Anesthesia Quick Evaluation

## 2024-07-29 NOTE — Transfer of Care (Addendum)
 Immediate Anesthesia Transfer of Care Note  Patient: Kathy Lyons  Procedure(s) Performed: COLONOSCOPY EGD (ESOPHAGOGASTRODUODENOSCOPY)  Patient Location: Short Stay  Anesthesia Type:General  Level of Consciousness: drowsy and patient cooperative  Airway & Oxygen Therapy: Patient Spontanous Breathing  Post-op Assessment: Report given to RN and Post -op Vital signs reviewed and stable  Post vital signs: Reviewed and stable  Last Vitals:  Vitals Value Taken Time  BP 103/58 07/29/24   1119  Temp 36.6 07/29/24   1119  Pulse 74 07/29/24   1119  Resp 13 07/29/24   1119  SpO2 100% 07/29/24   1119    Last Pain:  Vitals:   07/29/24 1042  TempSrc:   PainSc: 3       Patients Stated Pain Goal: 6 (07/29/24 0931)  Complications: No notable events documented.

## 2024-07-29 NOTE — Discharge Instructions (Signed)

## 2024-07-30 ENCOUNTER — Encounter (HOSPITAL_COMMUNITY): Payer: Self-pay | Admitting: Gastroenterology

## 2024-07-30 LAB — SURGICAL PATHOLOGY

## 2024-07-30 NOTE — Anesthesia Postprocedure Evaluation (Signed)
 Anesthesia Post Note  Patient: Kathy Lyons  Procedure(s) Performed: COLONOSCOPY EGD (ESOPHAGOGASTRODUODENOSCOPY)  Patient location during evaluation: Phase II Anesthesia Type: General Level of consciousness: awake Pain management: pain level controlled Vital Signs Assessment: post-procedure vital signs reviewed and stable Respiratory status: spontaneous breathing and respiratory function stable Cardiovascular status: blood pressure returned to baseline and stable Postop Assessment: no headache and no apparent nausea or vomiting Anesthetic complications: no Comments: Late entry   No notable events documented.   Last Vitals:  Vitals:   07/29/24 0931 07/29/24 1117  BP: 131/86 (!) 103/58  Pulse: 60 75  Resp: 18 (!) 0  Temp: 36.9 C 36.6 C  SpO2: 100% 100%    Last Pain:  Vitals:   07/29/24 1117  TempSrc: Axillary  PainSc: Asleep                 Yvonna JINNY Bosworth

## 2024-08-02 ENCOUNTER — Ambulatory Visit (INDEPENDENT_AMBULATORY_CARE_PROVIDER_SITE_OTHER): Payer: Self-pay | Admitting: Gastroenterology

## 2024-08-03 ENCOUNTER — Encounter (INDEPENDENT_AMBULATORY_CARE_PROVIDER_SITE_OTHER): Payer: Self-pay | Admitting: *Deleted

## 2024-08-03 NOTE — Progress Notes (Signed)
 5 yr TCS noted in recall Patient's PCP is on EPIC  procedure note and pathology result faxed to PCP

## 2024-09-21 ENCOUNTER — Telehealth (INDEPENDENT_AMBULATORY_CARE_PROVIDER_SITE_OTHER): Payer: Self-pay | Admitting: Gastroenterology

## 2024-09-21 NOTE — Telephone Encounter (Signed)
 Pt contacted. Pt states that for the last month or more (but more persistent over the last 2 weeks) she has been having pain/discomfort on her left side under her ribs. Also reports some shortness of breath. Does have nausea off and on. The pain/discomfort will go away a few hours after eating. Pt is wondering if it could be her gallbladder. Please advise. Thank you  Pt states she is available on Fridays-is willing to go to [facility].

## 2024-09-21 NOTE — Telephone Encounter (Signed)
 Patient had a procedure recently by Dr Cinderella and has noticed the past 2 weeks of having left sided pain after she eats with some nausea. I offered her an appointment with Dr Cinderella for tomorrow, but she can't come and asked to be seen this Friday. I told her we did not have anything available this Friday and the next available would be in January. She said she would follow up with her PCP because she thinks it's her gallbladder and asked for the nurse to also call her. [phone]

## 2024-09-24 NOTE — Addendum Note (Signed)
 Addended by: CINDERELLA DEATRICE SMILES on: 09/24/2024 12:15 PM   Modules accepted: Orders

## 2024-09-24 NOTE — Telephone Encounter (Signed)
 Hi I am just seeing this . I can order an Ultrasound to assess the gallbladder and we can see her in the clinic  ==========  Hi Kathy Lyons,   Can you please schedule a Abdominal ultrasound? Dx: Abdominal pain .   Thanks,  Satin Boal Faizan Laverda Stribling, MD Gastroenterology and Hepatology [facility]

## 2024-09-27 NOTE — Telephone Encounter (Signed)
 Pt contacted. Pt states she was able to get in with her PCP on Friday. He did blood work and stated it sounds like classical gallbladder. Pt goes tomorrow for Ultrasound at [facility]. Pt states PCP stated pending that they may need to do CT. Pt states she will keep us  informed.

## 2024-10-04 ENCOUNTER — Telehealth (INDEPENDENT_AMBULATORY_CARE_PROVIDER_SITE_OTHER): Payer: Self-pay

## 2024-10-04 NOTE — Telephone Encounter (Signed)
 Patient stated that she has already had an ultrasound and is having a CT tomorrow. I asked her if she could get those results [phone] [phone] to us  and she said yes she would.

## 2024-10-14 ENCOUNTER — Other Ambulatory Visit (HOSPITAL_COMMUNITY): Payer: PRIVATE HEALTH INSURANCE
